# Patient Record
Sex: Female | Born: 2000 | Race: White | Hispanic: No | Marital: Single | Smoking: Never smoker
Health system: Southern US, Community
[De-identification: ages and names within clinical notes are randomized; demographics above are authoritative.]

## PROBLEM LIST (undated history)

## (undated) DIAGNOSIS — R61 Generalized hyperhidrosis: Secondary | ICD-10-CM

## (undated) DIAGNOSIS — R002 Palpitations: Secondary | ICD-10-CM

## (undated) HISTORY — DX: Palpitations: R00.2

## (undated) HISTORY — DX: Generalized hyperhidrosis: R61

---
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
## 2020-03-30 DIAGNOSIS — F902 Attention-deficit hyperactivity disorder, combined type: Secondary | ICD-10-CM | POA: Diagnosis not present

## 2020-04-13 DIAGNOSIS — Z1331 Encounter for screening for depression: Secondary | ICD-10-CM | POA: Diagnosis not present

## 2020-04-13 DIAGNOSIS — E038 Other specified hypothyroidism: Secondary | ICD-10-CM | POA: Diagnosis not present

## 2020-08-18 DIAGNOSIS — M25561 Pain in right knee: Secondary | ICD-10-CM | POA: Diagnosis not present

## 2020-08-19 DIAGNOSIS — M25561 Pain in right knee: Secondary | ICD-10-CM | POA: Diagnosis not present

## 2020-08-31 DIAGNOSIS — R0981 Nasal congestion: Secondary | ICD-10-CM | POA: Diagnosis not present

## 2020-09-14 DIAGNOSIS — H52203 Unspecified astigmatism, bilateral: Secondary | ICD-10-CM | POA: Diagnosis not present

## 2020-09-14 DIAGNOSIS — H5213 Myopia, bilateral: Secondary | ICD-10-CM | POA: Diagnosis not present

## 2020-10-05 DIAGNOSIS — Z20822 Contact with and (suspected) exposure to covid-19: Secondary | ICD-10-CM | POA: Diagnosis not present

## 2020-10-09 DIAGNOSIS — M25561 Pain in right knee: Secondary | ICD-10-CM | POA: Diagnosis not present

## 2020-12-26 DIAGNOSIS — Z113 Encounter for screening for infections with a predominantly sexual mode of transmission: Secondary | ICD-10-CM | POA: Diagnosis not present

## 2020-12-26 DIAGNOSIS — Z6827 Body mass index (BMI) 27.0-27.9, adult: Secondary | ICD-10-CM | POA: Diagnosis not present

## 2020-12-26 DIAGNOSIS — Z01419 Encounter for gynecological examination (general) (routine) without abnormal findings: Secondary | ICD-10-CM | POA: Diagnosis not present

## 2021-01-25 DIAGNOSIS — F438 Other reactions to severe stress: Secondary | ICD-10-CM | POA: Diagnosis not present

## 2021-01-25 DIAGNOSIS — F418 Other specified anxiety disorders: Secondary | ICD-10-CM | POA: Diagnosis not present

## 2021-01-25 DIAGNOSIS — F900 Attention-deficit hyperactivity disorder, predominantly inattentive type: Secondary | ICD-10-CM | POA: Diagnosis not present

## 2021-01-30 DIAGNOSIS — F438 Other reactions to severe stress: Secondary | ICD-10-CM | POA: Diagnosis not present

## 2021-01-30 DIAGNOSIS — F418 Other specified anxiety disorders: Secondary | ICD-10-CM | POA: Diagnosis not present

## 2021-01-30 DIAGNOSIS — F900 Attention-deficit hyperactivity disorder, predominantly inattentive type: Secondary | ICD-10-CM | POA: Diagnosis not present

## 2021-02-08 DIAGNOSIS — F418 Other specified anxiety disorders: Secondary | ICD-10-CM | POA: Diagnosis not present

## 2021-02-08 DIAGNOSIS — F438 Other reactions to severe stress: Secondary | ICD-10-CM | POA: Diagnosis not present

## 2021-02-08 DIAGNOSIS — F900 Attention-deficit hyperactivity disorder, predominantly inattentive type: Secondary | ICD-10-CM | POA: Diagnosis not present

## 2021-02-10 DIAGNOSIS — J988 Other specified respiratory disorders: Secondary | ICD-10-CM | POA: Diagnosis not present

## 2021-02-10 DIAGNOSIS — H60331 Swimmer's ear, right ear: Secondary | ICD-10-CM | POA: Diagnosis not present

## 2021-02-21 DIAGNOSIS — F418 Other specified anxiety disorders: Secondary | ICD-10-CM | POA: Diagnosis not present

## 2021-02-21 DIAGNOSIS — F438 Other reactions to severe stress: Secondary | ICD-10-CM | POA: Diagnosis not present

## 2021-02-21 DIAGNOSIS — F900 Attention-deficit hyperactivity disorder, predominantly inattentive type: Secondary | ICD-10-CM | POA: Diagnosis not present

## 2021-02-27 DIAGNOSIS — N952 Postmenopausal atrophic vaginitis: Secondary | ICD-10-CM | POA: Diagnosis not present

## 2021-04-23 DIAGNOSIS — E7801 Familial hypercholesterolemia: Secondary | ICD-10-CM | POA: Diagnosis not present

## 2021-04-23 DIAGNOSIS — Z0000 Encounter for general adult medical examination without abnormal findings: Secondary | ICD-10-CM | POA: Diagnosis not present

## 2021-04-23 DIAGNOSIS — E039 Hypothyroidism, unspecified: Secondary | ICD-10-CM | POA: Diagnosis not present

## 2021-04-26 DIAGNOSIS — E039 Hypothyroidism, unspecified: Secondary | ICD-10-CM | POA: Diagnosis not present

## 2021-04-26 DIAGNOSIS — Z1339 Encounter for screening examination for other mental health and behavioral disorders: Secondary | ICD-10-CM | POA: Diagnosis not present

## 2021-04-26 DIAGNOSIS — Z0000 Encounter for general adult medical examination without abnormal findings: Secondary | ICD-10-CM | POA: Diagnosis not present

## 2021-04-26 DIAGNOSIS — Z1331 Encounter for screening for depression: Secondary | ICD-10-CM | POA: Diagnosis not present

## 2021-09-20 DIAGNOSIS — H5213 Myopia, bilateral: Secondary | ICD-10-CM | POA: Diagnosis not present

## 2021-09-20 DIAGNOSIS — H52203 Unspecified astigmatism, bilateral: Secondary | ICD-10-CM | POA: Diagnosis not present

## 2022-02-13 DIAGNOSIS — Z111 Encounter for screening for respiratory tuberculosis: Secondary | ICD-10-CM | POA: Diagnosis not present

## 2022-02-25 DIAGNOSIS — Z113 Encounter for screening for infections with a predominantly sexual mode of transmission: Secondary | ICD-10-CM | POA: Diagnosis not present

## 2022-02-25 DIAGNOSIS — Z6825 Body mass index (BMI) 25.0-25.9, adult: Secondary | ICD-10-CM | POA: Diagnosis not present

## 2022-02-25 DIAGNOSIS — Z01419 Encounter for gynecological examination (general) (routine) without abnormal findings: Secondary | ICD-10-CM | POA: Diagnosis not present

## 2022-04-05 DIAGNOSIS — F411 Generalized anxiety disorder: Secondary | ICD-10-CM | POA: Diagnosis not present

## 2022-04-05 DIAGNOSIS — F810 Specific reading disorder: Secondary | ICD-10-CM | POA: Diagnosis not present

## 2022-04-05 DIAGNOSIS — F900 Attention-deficit hyperactivity disorder, predominantly inattentive type: Secondary | ICD-10-CM | POA: Diagnosis not present

## 2022-04-29 DIAGNOSIS — E7801 Familial hypercholesterolemia: Secondary | ICD-10-CM | POA: Diagnosis not present

## 2022-05-01 DIAGNOSIS — D7289 Other specified disorders of white blood cells: Secondary | ICD-10-CM | POA: Diagnosis not present

## 2022-05-09 DIAGNOSIS — Z1331 Encounter for screening for depression: Secondary | ICD-10-CM | POA: Diagnosis not present

## 2022-05-09 DIAGNOSIS — E039 Hypothyroidism, unspecified: Secondary | ICD-10-CM | POA: Diagnosis not present

## 2022-05-09 DIAGNOSIS — Z1339 Encounter for screening examination for other mental health and behavioral disorders: Secondary | ICD-10-CM | POA: Diagnosis not present

## 2022-05-09 DIAGNOSIS — Z0000 Encounter for general adult medical examination without abnormal findings: Secondary | ICD-10-CM | POA: Diagnosis not present

## 2022-05-09 DIAGNOSIS — Z23 Encounter for immunization: Secondary | ICD-10-CM | POA: Diagnosis not present

## 2022-05-20 ENCOUNTER — Ambulatory Visit: Payer: BC Managed Care – PPO | Admitting: Internal Medicine

## 2022-05-24 DIAGNOSIS — K644 Residual hemorrhoidal skin tags: Secondary | ICD-10-CM | POA: Diagnosis not present

## 2022-05-28 ENCOUNTER — Encounter: Payer: Self-pay | Admitting: *Deleted

## 2022-10-02 DIAGNOSIS — H5213 Myopia, bilateral: Secondary | ICD-10-CM | POA: Diagnosis not present

## 2022-10-02 DIAGNOSIS — H52203 Unspecified astigmatism, bilateral: Secondary | ICD-10-CM | POA: Diagnosis not present

## 2022-10-02 DIAGNOSIS — H04122 Dry eye syndrome of left lacrimal gland: Secondary | ICD-10-CM | POA: Diagnosis not present

## 2022-11-28 DIAGNOSIS — Z23 Encounter for immunization: Secondary | ICD-10-CM | POA: Diagnosis not present

## 2022-11-28 DIAGNOSIS — S6991XA Unspecified injury of right wrist, hand and finger(s), initial encounter: Secondary | ICD-10-CM | POA: Diagnosis not present

## 2022-11-28 DIAGNOSIS — S6710XA Crushing injury of unspecified finger(s), initial encounter: Secondary | ICD-10-CM | POA: Diagnosis not present

## 2023-03-24 ENCOUNTER — Other Ambulatory Visit (HOSPITAL_COMMUNITY): Payer: Self-pay

## 2023-03-24 MED ORDER — AMPHET-DEXTROAMPHET 3-BEAD ER 25 MG PO CP24
25.0000 mg | ORAL_CAPSULE | Freq: Every morning | ORAL | 0 refills | Status: DC
  Filled 2023-03-24: qty 30, 30d supply, fill #0

## 2023-03-25 DIAGNOSIS — Z124 Encounter for screening for malignant neoplasm of cervix: Secondary | ICD-10-CM | POA: Diagnosis not present

## 2023-03-25 DIAGNOSIS — Z113 Encounter for screening for infections with a predominantly sexual mode of transmission: Secondary | ICD-10-CM | POA: Diagnosis not present

## 2023-03-25 DIAGNOSIS — Z01419 Encounter for gynecological examination (general) (routine) without abnormal findings: Secondary | ICD-10-CM | POA: Diagnosis not present

## 2023-04-21 ENCOUNTER — Other Ambulatory Visit (HOSPITAL_COMMUNITY): Payer: Self-pay

## 2023-04-21 MED ORDER — AMPHET-DEXTROAMPHET 3-BEAD ER 25 MG PO CP24
1.0000 | ORAL_CAPSULE | Freq: Every morning | ORAL | 0 refills | Status: DC
  Filled 2023-04-21: qty 30, 30d supply, fill #0

## 2023-04-22 ENCOUNTER — Other Ambulatory Visit (HOSPITAL_COMMUNITY): Payer: Self-pay

## 2023-05-25 ENCOUNTER — Other Ambulatory Visit (HOSPITAL_COMMUNITY): Payer: Self-pay

## 2023-05-26 ENCOUNTER — Other Ambulatory Visit (HOSPITAL_COMMUNITY): Payer: Self-pay

## 2023-05-26 MED ORDER — AMPHET-DEXTROAMPHET 3-BEAD ER 25 MG PO CP24
1.0000 | ORAL_CAPSULE | Freq: Every morning | ORAL | 0 refills | Status: DC
  Filled 2023-05-26: qty 30, 30d supply, fill #0

## 2023-05-28 ENCOUNTER — Other Ambulatory Visit (HOSPITAL_COMMUNITY): Payer: Self-pay

## 2023-05-28 ENCOUNTER — Other Ambulatory Visit (HOSPITAL_BASED_OUTPATIENT_CLINIC_OR_DEPARTMENT_OTHER): Payer: Self-pay

## 2023-05-29 ENCOUNTER — Other Ambulatory Visit (HOSPITAL_COMMUNITY): Payer: Self-pay

## 2023-05-29 ENCOUNTER — Other Ambulatory Visit: Payer: Self-pay

## 2023-06-02 ENCOUNTER — Other Ambulatory Visit (HOSPITAL_COMMUNITY): Payer: Self-pay

## 2023-06-27 ENCOUNTER — Other Ambulatory Visit (HOSPITAL_COMMUNITY): Payer: Self-pay

## 2023-06-27 MED ORDER — AMPHET-DEXTROAMPHET 3-BEAD ER 25 MG PO CP24
25.0000 mg | ORAL_CAPSULE | Freq: Every morning | ORAL | 0 refills | Status: DC
  Filled 2023-06-27 (×2): qty 30, 30d supply, fill #0

## 2023-07-01 ENCOUNTER — Other Ambulatory Visit (HOSPITAL_COMMUNITY): Payer: Self-pay

## 2023-07-04 ENCOUNTER — Other Ambulatory Visit (HOSPITAL_COMMUNITY): Payer: Self-pay

## 2023-07-29 ENCOUNTER — Other Ambulatory Visit (HOSPITAL_COMMUNITY): Payer: Self-pay

## 2023-07-29 MED ORDER — AMPHET-DEXTROAMPHET 3-BEAD ER 25 MG PO CP24
25.0000 mg | ORAL_CAPSULE | Freq: Every morning | ORAL | 0 refills | Status: DC
Start: 2023-07-29 — End: 2023-08-26
  Filled 2023-07-29: qty 30, 30d supply, fill #0

## 2023-07-30 ENCOUNTER — Other Ambulatory Visit (HOSPITAL_COMMUNITY): Payer: Self-pay

## 2023-08-26 ENCOUNTER — Other Ambulatory Visit (HOSPITAL_COMMUNITY): Payer: Self-pay

## 2023-08-26 MED ORDER — AMPHET-DEXTROAMPHET 3-BEAD ER 25 MG PO CP24
25.0000 mg | ORAL_CAPSULE | Freq: Every morning | ORAL | 0 refills | Status: DC
Start: 2023-08-26 — End: 2023-09-29
  Filled 2023-08-26: qty 30, 30d supply, fill #0

## 2023-08-28 ENCOUNTER — Other Ambulatory Visit (HOSPITAL_COMMUNITY): Payer: Self-pay

## 2023-09-29 ENCOUNTER — Other Ambulatory Visit: Payer: Self-pay

## 2023-09-29 ENCOUNTER — Other Ambulatory Visit (HOSPITAL_COMMUNITY): Payer: Self-pay

## 2023-09-29 MED ORDER — AMPHET-DEXTROAMPHET 3-BEAD ER 25 MG PO CP24
25.0000 mg | ORAL_CAPSULE | Freq: Every morning | ORAL | 0 refills | Status: AC
Start: 2023-09-27 — End: ?
  Filled 2023-09-29: qty 30, 30d supply, fill #0

## 2023-09-30 ENCOUNTER — Other Ambulatory Visit (HOSPITAL_COMMUNITY): Payer: Self-pay

## 2023-10-29 ENCOUNTER — Other Ambulatory Visit (HOSPITAL_COMMUNITY): Payer: Self-pay

## 2023-10-29 ENCOUNTER — Other Ambulatory Visit (HOSPITAL_BASED_OUTPATIENT_CLINIC_OR_DEPARTMENT_OTHER): Payer: Self-pay

## 2023-10-30 ENCOUNTER — Other Ambulatory Visit (HOSPITAL_COMMUNITY): Payer: Self-pay

## 2023-10-30 MED ORDER — AMPHET-DEXTROAMPHET 3-BEAD ER 25 MG PO CP24
25.0000 mg | ORAL_CAPSULE | Freq: Every morning | ORAL | 0 refills | Status: DC
Start: 2023-10-30 — End: 2023-11-03
  Filled 2023-10-30: qty 30, 30d supply, fill #0

## 2023-11-03 ENCOUNTER — Other Ambulatory Visit (HOSPITAL_COMMUNITY): Payer: Self-pay

## 2023-11-03 MED ORDER — AMPHET-DEXTROAMPHET 3-BEAD ER 25 MG PO CP24
1.0000 | ORAL_CAPSULE | Freq: Every morning | ORAL | 0 refills | Status: AC
  Filled 2023-11-03: qty 30, 30d supply, fill #0

## 2023-11-04 DIAGNOSIS — E7801 Familial hypercholesterolemia: Secondary | ICD-10-CM | POA: Diagnosis not present

## 2023-11-04 DIAGNOSIS — E039 Hypothyroidism, unspecified: Secondary | ICD-10-CM | POA: Diagnosis not present

## 2023-11-04 DIAGNOSIS — R000 Tachycardia, unspecified: Secondary | ICD-10-CM | POA: Diagnosis not present

## 2023-11-04 DIAGNOSIS — Z0000 Encounter for general adult medical examination without abnormal findings: Secondary | ICD-10-CM | POA: Diagnosis not present

## 2023-11-06 DIAGNOSIS — Z0000 Encounter for general adult medical examination without abnormal findings: Secondary | ICD-10-CM | POA: Diagnosis not present

## 2023-11-06 DIAGNOSIS — E039 Hypothyroidism, unspecified: Secondary | ICD-10-CM | POA: Diagnosis not present

## 2023-11-06 DIAGNOSIS — Z1331 Encounter for screening for depression: Secondary | ICD-10-CM | POA: Diagnosis not present

## 2023-11-06 DIAGNOSIS — Z1339 Encounter for screening examination for other mental health and behavioral disorders: Secondary | ICD-10-CM | POA: Diagnosis not present

## 2023-11-07 DIAGNOSIS — H5213 Myopia, bilateral: Secondary | ICD-10-CM | POA: Diagnosis not present

## 2023-11-27 ENCOUNTER — Other Ambulatory Visit (HOSPITAL_COMMUNITY): Payer: Self-pay

## 2023-11-27 MED ORDER — AMPHET-DEXTROAMPHET 3-BEAD ER 25 MG PO CP24
25.0000 mg | ORAL_CAPSULE | Freq: Every morning | ORAL | 0 refills | Status: AC
Start: 2023-11-28 — End: ?
  Filled 2023-11-27 – 2023-11-28 (×2): qty 30, 30d supply, fill #0

## 2023-11-28 ENCOUNTER — Other Ambulatory Visit (HOSPITAL_COMMUNITY): Payer: Self-pay

## 2023-12-29 ENCOUNTER — Other Ambulatory Visit (HOSPITAL_COMMUNITY): Payer: Self-pay

## 2023-12-29 MED ORDER — AMPHET-DEXTROAMPHET 3-BEAD ER 25 MG PO CP24
25.0000 mg | ORAL_CAPSULE | Freq: Every morning | ORAL | 0 refills | Status: AC
Start: 2023-12-28 — End: ?
  Filled 2023-12-29: qty 30, 30d supply, fill #0

## 2024-01-30 ENCOUNTER — Other Ambulatory Visit (HOSPITAL_COMMUNITY): Payer: Self-pay

## 2024-01-30 MED ORDER — AMPHET-DEXTROAMPHET 3-BEAD ER 37.5 MG PO CP24
1.0000 | ORAL_CAPSULE | Freq: Every morning | ORAL | 0 refills | Status: DC
Start: 2024-01-29 — End: 2024-03-02
  Filled 2024-01-30: qty 30, 30d supply, fill #0

## 2024-02-09 DIAGNOSIS — E785 Hyperlipidemia, unspecified: Secondary | ICD-10-CM | POA: Diagnosis not present

## 2024-02-09 DIAGNOSIS — E039 Hypothyroidism, unspecified: Secondary | ICD-10-CM | POA: Diagnosis not present

## 2024-03-02 ENCOUNTER — Other Ambulatory Visit (HOSPITAL_COMMUNITY): Payer: Self-pay

## 2024-03-02 MED ORDER — AMPHET-DEXTROAMPHET 3-BEAD ER 37.5 MG PO CP24
1.0000 | ORAL_CAPSULE | Freq: Every morning | ORAL | 0 refills | Status: AC
Start: 2024-03-01 — End: ?
  Filled 2024-03-02: qty 30, 30d supply, fill #0

## 2024-03-17 ENCOUNTER — Other Ambulatory Visit (HOSPITAL_COMMUNITY): Payer: Self-pay

## 2024-03-17 MED ORDER — AZELAIC ACID 15 % EX GEL
1.0000 | Freq: Two times a day (BID) | CUTANEOUS | 3 refills | Status: AC
  Filled 2024-03-17 – 2024-10-19 (×3): qty 50, 30d supply, fill #0

## 2024-03-17 MED ORDER — CLINDAMYCIN PHOSPHATE 1 % EX LOTN
1.0000 | TOPICAL_LOTION | Freq: Every morning | CUTANEOUS | 3 refills | Status: AC
  Filled 2024-03-17: qty 60, 15d supply, fill #0
  Filled 2024-03-30 – 2024-04-28 (×2): qty 120, 30d supply, fill #0

## 2024-03-17 MED ORDER — SULFACETAMIDE SODIUM-SULFUR 9.8-4.8 % EX LIQD
1.0000 | Freq: Every day | CUTANEOUS | 4 refills | Status: AC
  Filled 2024-03-17 – 2024-04-28 (×2): qty 285, 30d supply, fill #0

## 2024-03-17 MED ORDER — TRETINOIN 0.05 % EX CREA
1.0000 | TOPICAL_CREAM | Freq: Every day | CUTANEOUS | 5 refills | Status: AC
  Filled 2024-03-17 – 2024-04-28 (×2): qty 45, 30d supply, fill #0
  Filled 2024-10-19: qty 45, 30d supply, fill #1

## 2024-03-18 ENCOUNTER — Encounter (HOSPITAL_COMMUNITY): Payer: Self-pay

## 2024-03-18 ENCOUNTER — Other Ambulatory Visit (HOSPITAL_COMMUNITY): Payer: Self-pay

## 2024-03-29 ENCOUNTER — Encounter (HOSPITAL_COMMUNITY): Payer: Self-pay

## 2024-03-29 ENCOUNTER — Other Ambulatory Visit (HOSPITAL_COMMUNITY): Payer: Self-pay

## 2024-03-29 MED ORDER — AMPHET-DEXTROAMPHET 3-BEAD ER 37.5 MG PO CP24
37.5000 mg | ORAL_CAPSULE | Freq: Every morning | ORAL | 0 refills | Status: DC
  Filled 2024-03-30: qty 30, 30d supply, fill #0
  Filled ????-??-??: fill #0

## 2024-03-30 ENCOUNTER — Other Ambulatory Visit: Payer: Self-pay

## 2024-03-30 ENCOUNTER — Other Ambulatory Visit (HOSPITAL_COMMUNITY): Payer: Self-pay

## 2024-04-09 ENCOUNTER — Other Ambulatory Visit (HOSPITAL_COMMUNITY): Payer: Self-pay

## 2024-04-27 ENCOUNTER — Other Ambulatory Visit (HOSPITAL_COMMUNITY): Payer: Self-pay

## 2024-04-27 DIAGNOSIS — Z13228 Encounter for screening for other metabolic disorders: Secondary | ICD-10-CM | POA: Diagnosis not present

## 2024-04-27 DIAGNOSIS — Z130 Encounter for screening for diseases of the blood and blood-forming organs and certain disorders involving the immune mechanism: Secondary | ICD-10-CM | POA: Diagnosis not present

## 2024-04-27 DIAGNOSIS — Z01419 Encounter for gynecological examination (general) (routine) without abnormal findings: Secondary | ICD-10-CM | POA: Diagnosis not present

## 2024-04-27 DIAGNOSIS — Z6826 Body mass index (BMI) 26.0-26.9, adult: Secondary | ICD-10-CM | POA: Diagnosis not present

## 2024-04-27 DIAGNOSIS — Z1329 Encounter for screening for other suspected endocrine disorder: Secondary | ICD-10-CM | POA: Diagnosis not present

## 2024-04-27 DIAGNOSIS — N760 Acute vaginitis: Secondary | ICD-10-CM | POA: Diagnosis not present

## 2024-04-27 DIAGNOSIS — Z113 Encounter for screening for infections with a predominantly sexual mode of transmission: Secondary | ICD-10-CM | POA: Diagnosis not present

## 2024-04-27 DIAGNOSIS — Z124 Encounter for screening for malignant neoplasm of cervix: Secondary | ICD-10-CM | POA: Diagnosis not present

## 2024-04-27 MED ORDER — AMPHET-DEXTROAMPHET 3-BEAD ER 37.5 MG PO CP24
1.0000 | ORAL_CAPSULE | Freq: Every morning | ORAL | 0 refills | Status: DC
  Filled 2024-04-27: qty 30, 30d supply, fill #0

## 2024-04-28 ENCOUNTER — Other Ambulatory Visit (HOSPITAL_COMMUNITY): Payer: Self-pay

## 2024-05-10 ENCOUNTER — Other Ambulatory Visit (HOSPITAL_COMMUNITY): Payer: Self-pay

## 2024-05-27 ENCOUNTER — Other Ambulatory Visit (HOSPITAL_COMMUNITY): Payer: Self-pay

## 2024-05-27 ENCOUNTER — Other Ambulatory Visit: Payer: Self-pay

## 2024-05-27 MED ORDER — AMPHET-DEXTROAMPHET 3-BEAD ER 37.5 MG PO CP24
37.5000 mg | ORAL_CAPSULE | Freq: Every morning | ORAL | 0 refills | Status: DC
  Filled 2024-05-27: qty 30, 30d supply, fill #0

## 2024-06-28 ENCOUNTER — Other Ambulatory Visit (HOSPITAL_COMMUNITY): Payer: Self-pay

## 2024-06-28 MED ORDER — AMPHET-DEXTROAMPHET 3-BEAD ER 37.5 MG PO CP24
37.5000 mg | ORAL_CAPSULE | Freq: Every morning | ORAL | 0 refills | Status: DC
  Filled 2024-06-28: qty 30, 30d supply, fill #0

## 2024-10-01 ENCOUNTER — Other Ambulatory Visit (HOSPITAL_COMMUNITY): Payer: Self-pay

## 2024-10-01 MED ORDER — AMPHET-DEXTROAMPHET 3-BEAD ER 12.5 MG PO CP24
12.5000 mg | ORAL_CAPSULE | Freq: Every morning | ORAL | 0 refills | Status: AC
  Filled 2024-10-01: qty 30, 30d supply, fill #0

## 2024-10-04 ENCOUNTER — Other Ambulatory Visit (HOSPITAL_COMMUNITY): Payer: Self-pay

## 2024-10-19 ENCOUNTER — Encounter (HOSPITAL_COMMUNITY): Payer: Self-pay

## 2024-10-19 ENCOUNTER — Other Ambulatory Visit (HOSPITAL_COMMUNITY): Payer: Self-pay

## 2024-10-19 ENCOUNTER — Other Ambulatory Visit: Payer: Self-pay
